# Patient Record
Sex: Female | Born: 1985 | Race: White | Hispanic: No | Marital: Married | Smoking: Never smoker
Health system: Southern US, Community
[De-identification: ages and names within clinical notes are randomized; demographics above are authoritative.]

## PROBLEM LIST (undated history)

## (undated) DIAGNOSIS — F419 Anxiety disorder, unspecified: Secondary | ICD-10-CM

---
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
## 2007-10-29 ENCOUNTER — Inpatient Hospital Stay (HOSPITAL_COMMUNITY): Admission: AD | Admit: 2007-10-29 | Discharge: 2007-10-29 | Payer: Self-pay | Admitting: Obstetrics and Gynecology

## 2008-03-20 ENCOUNTER — Inpatient Hospital Stay (HOSPITAL_COMMUNITY): Admission: AD | Admit: 2008-03-20 | Discharge: 2008-03-20 | Payer: Self-pay | Admitting: Obstetrics and Gynecology

## 2008-04-01 ENCOUNTER — Inpatient Hospital Stay (HOSPITAL_COMMUNITY): Admission: AD | Admit: 2008-04-01 | Discharge: 2008-04-01 | Payer: Self-pay | Admitting: Obstetrics & Gynecology

## 2008-04-23 ENCOUNTER — Inpatient Hospital Stay (HOSPITAL_COMMUNITY): Admission: AD | Admit: 2008-04-23 | Discharge: 2008-04-26 | Payer: Self-pay | Admitting: Obstetrics and Gynecology

## 2008-12-14 ENCOUNTER — Emergency Department (HOSPITAL_BASED_OUTPATIENT_CLINIC_OR_DEPARTMENT_OTHER): Admission: EM | Admit: 2008-12-14 | Discharge: 2008-12-14 | Payer: Self-pay | Admitting: Emergency Medicine

## 2008-12-14 ENCOUNTER — Ambulatory Visit: Payer: Self-pay | Admitting: Diagnostic Radiology

## 2009-02-27 ENCOUNTER — Ambulatory Visit: Payer: Self-pay | Admitting: Radiology

## 2009-02-27 ENCOUNTER — Encounter (INDEPENDENT_AMBULATORY_CARE_PROVIDER_SITE_OTHER): Payer: Self-pay | Admitting: *Deleted

## 2009-02-27 ENCOUNTER — Emergency Department (HOSPITAL_BASED_OUTPATIENT_CLINIC_OR_DEPARTMENT_OTHER): Admission: EM | Admit: 2009-02-27 | Discharge: 2009-02-27 | Payer: Self-pay | Admitting: Emergency Medicine

## 2009-03-10 ENCOUNTER — Encounter (INDEPENDENT_AMBULATORY_CARE_PROVIDER_SITE_OTHER): Payer: Self-pay | Admitting: *Deleted

## 2009-03-10 ENCOUNTER — Emergency Department (HOSPITAL_COMMUNITY): Admission: EM | Admit: 2009-03-10 | Discharge: 2009-03-11 | Payer: Self-pay | Admitting: Emergency Medicine

## 2009-03-20 ENCOUNTER — Ambulatory Visit (HOSPITAL_COMMUNITY): Admission: RE | Admit: 2009-03-20 | Discharge: 2009-03-20 | Payer: Self-pay | Admitting: Gastroenterology

## 2009-03-20 ENCOUNTER — Encounter (INDEPENDENT_AMBULATORY_CARE_PROVIDER_SITE_OTHER): Payer: Self-pay | Admitting: *Deleted

## 2009-03-23 ENCOUNTER — Ambulatory Visit: Payer: Self-pay | Admitting: Internal Medicine

## 2010-06-13 IMAGING — NM NM HEPATO W/GB/PHARM/[PERSON_NAME]
2 series · 12 of 12 positions shown · non-contrast
Comparison: Ultrasound 02/27/2009

CLINICAL DATA: Right upper quadrant abdominal pain.

NUCLEAR MEDICINE HEPATOBILIARY IMAGING WITH GALLBLADDER EF
TECHNIQUE: Sequential images of the abdomen were obtained [DATE]
minutes following intravenous administration of
radiopharmaceutical.  After slow intravenous infusion of 1.87 uCg
Cholecystokinin, gallbladder ejection fraction was determined.
Radiopharmaceutical:  5.3 mCi Fc-WWm Choletec

[he hepatobiliary · 3.43mm/px · 6 of 59 frames shown (1 of 2)]
[frame 5/59]
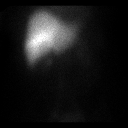
[frame 15/59]
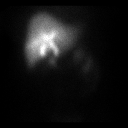
[frame 25/59]
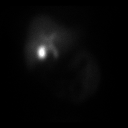
[frame 35/59]
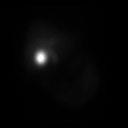
[frame 45/59]
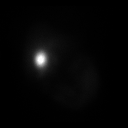
[frame 55/59]
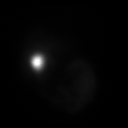

[he hepatobiliary · 3.43mm/px · 6 of 30 frames shown (2 of 2)]
[frame 3/30]
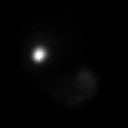
[frame 8/30]
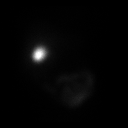
[frame 13/30]
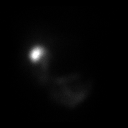
[frame 18/30]
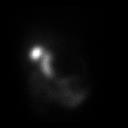
[frame 23/30]
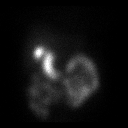
[frame 28/30]
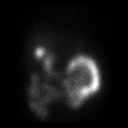

[12 of 12 positions shown; findings below may reference images not displayed]

FINDINGS: There is symmetric uptake in the liver and prompt
excretion into the biliary tree.  Gallbladder is visualized by 15
minutes and there is activity in the small bowel by 20 minutes.

Gallbladder ejection fraction was calculated 85%.  Normal is
greater than 30%.

The patient did experience symptoms during CCK infusion.
IMPRESSION: 1.  Normal biliary patency study.
2.  Normal gallbladder ejection fraction.
3.  Patient's symptoms reproduced with infusion of CCK.

## 2010-09-03 ENCOUNTER — Encounter: Payer: Self-pay | Admitting: Gastroenterology

## 2010-11-18 LAB — DIFFERENTIAL
Basophils Absolute: 0 10*3/uL (ref 0.0–0.1)
Basophils Absolute: 0 10*3/uL (ref 0.0–0.1)
Basophils Relative: 1 % (ref 0–1)
Basophils Relative: 0 % (ref 0–1)
Eosinophils Absolute: 0.1 10*3/uL (ref 0.0–0.7)
Eosinophils Relative: 0 % (ref 0–5)
Lymphocytes Relative: 24 % (ref 12–46)
Monocytes Absolute: 0.6 10*3/uL (ref 0.1–1.0)
Monocytes Relative: 6 % (ref 3–12)
Neutrophils Relative %: 56 % (ref 43–77)

## 2010-11-18 LAB — URINE MICROSCOPIC-ADD ON

## 2010-11-18 LAB — COMPREHENSIVE METABOLIC PANEL
ALT: 13 U/L (ref 0–35)
AST: 16 U/L (ref 0–37)
Albumin: 4.1 g/dL (ref 3.5–5.2)
Alkaline Phosphatase: 93 U/L (ref 39–117)
Alkaline Phosphatase: 77 U/L (ref 39–117)
CO2: 28 mEq/L (ref 19–32)
Calcium: 9.7 mg/dL (ref 8.4–10.5)
Chloride: 105 mEq/L (ref 96–112)
Chloride: 105 mEq/L (ref 96–112)
GFR calc Af Amer: >60 mL/min (ref >60)
GFR calc non Af Amer: >60 mL/min (ref >60)
Glucose, Bld: 97 mg/dL (ref 70–99)
Potassium: 4.5 mEq/L (ref 3.5–5.1)
Potassium: 3.5 mEq/L (ref 3.5–5.1)
Sodium: 142 mEq/L (ref 135–145)
Total Bilirubin: 0.3 mg/dL (ref 0.3–1.2)
Total Bilirubin: 0.7 mg/dL (ref 0.3–1.2)
Total Protein: 7.4 g/dL (ref 6.0–8.3)

## 2010-11-18 LAB — POCT PREGNANCY, URINE: Preg Test, Ur: NEGATIVE

## 2010-11-18 LAB — URINALYSIS, ROUTINE W REFLEX MICROSCOPIC
Bilirubin Urine: NEGATIVE
Glucose, UA: NEGATIVE mg/dL
Specific Gravity, Urine: 1.027 (ref 1.005–1.030)
Urobilinogen, UA: 0.2 mg/dL (ref 0.0–1.0)

## 2010-11-18 LAB — CBC
Hemoglobin: 14.9 g/dL (ref 12.0–15.0)
MCHC: 34.5 g/dL (ref 30.0–36.0)
Platelets: 229 10*3/uL (ref 150–400)
RBC: 5.09 MIL/uL (ref 3.87–5.11)
RDW: 14.6 % (ref 11.5–15.5)
WBC: 9.5 10*3/uL (ref 4.0–10.5)

## 2010-11-18 LAB — LIPASE, BLOOD: Lipase: 58 U/L (ref 23–300)

## 2010-12-25 NOTE — Op Note (Signed)
[name], [name] NO.:  [id number]   MEDICAL RECORD NO.:  [id number]          PATIENT TYPE:  INP   LOCATION:  9133                          FACILITY:  [facility]   PHYSICIAN:  Carrington Clamp, M.D. DATE OF BIRTH:  [birth year]   DATE OF PROCEDURE:  04/24/2008  DATE OF DISCHARGE:                               OPERATIVE REPORT   PREOPERATIVE DIAGNOSIS:  Arrest of active phase.   POSTOPERATIVE DIAGNOSIS:  Arrest of active phase.   PROCEDURE:  Low transverse cesarean section.   SURGEON:  Carrington Clamp, MD   ANESTHESIA:  Epidural.   ESTIMATED BLOOD LOSS:  700 mL.   URINE OUTPUT:  100 mL.   IV FLUIDS:  800 mL.   FINDINGS:  Female infant, vertex OP presentation, 7 pounds 8 ounces.  Apgars 9 and 10.  There were normal tubes, ovaries, and uterus seen.   COMPLICATIONS:  None.   PATHOLOGY:  None.   MEDICATIONS:  Ancef.   COUNTS:  Correct x3.   REASON FOR OPERATION:  Ms. Kim Estes has elected for a induction at  39 and half weeks with a favorable cervix.  The patient had been brought  in for Pitocin and rupture of membranes and had protractedly progressed  to 5 cm.  However, after 4 hours, the patient did not change from 5 cm  despite inadequate labor pattern.  After discussion of the risks,  benefits, and alternatives, the patient elected to undergo cesarean  section for arrest of active phase.   TECHNIQUE:  After adequate epidural anesthesia was achieved, the patient  was prepped and draped in the usual sterile fashion in the dorsal supine  position with leftward tilt.  A Pfannenstiel skin incision was made with  the scalpel and carried down to fascia with Bovie cautery.  The fascia  was incised in midline and carried in transverse curvilinear manner with  Mayo scissors.  The fascia was reflected superiorly and inferiorly from  the rectus muscle and the rectus muscle was split in the midline.  The  peritoneum was entered into bluntly and was  stretched open with good  visualization of the bowel and the bladder.  The Alexis instrument was  placed inside the abdominal cavity.  The vesicouterine fascia tented up  and incised in transverse curvilinear manner.  Blunt dissection was used  to create the bladder flap.   A 2-cm incision was made in the upper portion of lower uterine segment  until clear fluid was noted on entry into the amnion.  The baby was  identified in the vertex OP presentation, delivered without  complication.  The baby was bulb suctioned and the cord was clamped and  cut and the baby was handed to awaiting pediatrics.  The placenta was  then delivered manually and handed to the cord blood bank people.  The  uterus was a cleared of all debris with a wet lap.   The incision was then closed with a running a lock stitch of 0 Monocryl.  An imbricating layer of 0 Monocryl was performed as well.  Hemostasis  was achieved and irrigation was  performed.  The Alexis instrument was  then removed from the abdominal cavity and the peritoneum closed with a  running stitch of 2-0 Vicryl.  This incorporated the rectus muscles as a  separate layer.  The fascia was closed with a running stitch of 0  Vicryl.  The subcutaneous tissue was rendered hemostatic with Bovie  cautery, irrigation and then closed with interrupted stitches of 2-0  plain gut.  The skin was closed with staples.  The patient tolerated the  procedure well and was returned to recovery room in stable condition.      Carrington Clamp, M.D.  Electronically Signed     MH/MEDQ  D:  04/24/2008  T:  04/24/2008  Job:  [id number]

## 2010-12-28 NOTE — Discharge Summary (Signed)
NAMEJOYCLYN, Kim Estes NO.:  [id number]   MEDICAL RECORD NO.:  [id number]          PATIENT TYPE:  INP   LOCATION:  9133                          FACILITY:  [facility]   PHYSICIAN:  Lenoard Aden, M.D.DATE OF BIRTH:  Dec 27, 1985   DATE OF ADMISSION:  04/23/2008  DATE OF DISCHARGE:  04/26/2008                               DISCHARGE SUMMARY   FINAL DIAGNOSES:  Intrauterine pregnancy at 39+ weeks gestation,  positive group B strep, induction of labor, arrest of the active phase  of labor.   PROCEDURE:  Primary low transverse cesarean section.   SURGEON:  Carrington Clamp, MD   COMPLICATIONS:  None.   This 25 year old G1 P0 presents at 4 and half weeks gestation for an  induction secondary to a favorable cervix at term.  The patient's  antepartum course up to this point had just been complicated by a  positive group B strep culture which she was started on penicillin for.  Upon admission the patient also had a false positive HIV test at the  beginning of her pregnancy, but was confirmed negative by Western blot  test and otherwise the patient's antepartum course had been  uncomplicated.  The patient was admitted for induction.  She had a  protracted labor curve progressed about 5 cm, but did not change her  cervix over 4 hours, despite an adequate labor pattern discussion was  held with the patient.  Decision was made to proceed with the cesarean  section at this time secondary to the rest of the active phase of labor.  The patient was taken to the operating room on April 24, 2008 by Dr.  Carrington Clamp where primary low transverse cesarean section was  performed with the delivery of a 7 pounds 8 ounce female infant with  Apgars of 9 and 10.  Delivery went without complications.  The patient's  postoperative course was benign without any significant fevers.  She did  have some mild postoperative anemia.  The patient was already for  discharge on postoperative  day #2, was sent home on a regular diet, told  to decrease activities, told to continue her vitamins and iron  supplement, was given Motrin 600 mg to use every 6 hours as needed for  her pain, was to follow up in our office in 48 hours for her staple  removal.  Instructions and precautions were reviewed with the patient  and with the staple removal in 1 week.      Leilani Able, P.A.-C.      Lenoard Aden, M.D.  Electronically Signed    MB/MEDQ  D:  05/23/2008  T:  05/24/2008  Job:  [id number]

## 2011-05-06 LAB — URINALYSIS, ROUTINE W REFLEX MICROSCOPIC
Bilirubin Urine: NEGATIVE
Hgb urine dipstick: NEGATIVE
Ketones, ur: 40 — AB
Nitrite: NEGATIVE
Protein, ur: NEGATIVE
Specific Gravity, Urine: >1.03 — ABNORMAL HIGH
Urobilinogen, UA: 0.2

## 2011-05-10 LAB — URINALYSIS, ROUTINE W REFLEX MICROSCOPIC
Glucose, UA: NEGATIVE
Leukocytes, UA: NEGATIVE
Protein, ur: NEGATIVE
Specific Gravity, Urine: 1.025
pH: 6

## 2011-05-10 LAB — STREP B DNA PROBE

## 2011-05-10 LAB — URINE MICROSCOPIC-ADD ON

## 2011-05-10 LAB — URINE CULTURE

## 2011-05-15 LAB — CBC
HCT: 29.5 — ABNORMAL LOW
Hemoglobin: 11 — ABNORMAL LOW
MCHC: 33.1
MCV: 85.6
MCV: 85.6
Platelets: 157
Platelets: 177
RBC: 3.89
RDW: 14.4
RDW: 14.6
RDW: 14.8

## 2011-05-15 LAB — RPR: RPR Ser Ql: NONREACTIVE

## 2012-03-19 ENCOUNTER — Emergency Department (HOSPITAL_BASED_OUTPATIENT_CLINIC_OR_DEPARTMENT_OTHER)
Admission: EM | Admit: 2012-03-19 | Discharge: 2012-03-19 | Disposition: A | Discharge status: Home / Self Care | Payer: PRIVATE HEALTH INSURANCE | Attending: Emergency Medicine | Admitting: Emergency Medicine

## 2012-03-19 ENCOUNTER — Encounter (HOSPITAL_BASED_OUTPATIENT_CLINIC_OR_DEPARTMENT_OTHER): Payer: Self-pay

## 2012-03-19 ENCOUNTER — Emergency Department (HOSPITAL_BASED_OUTPATIENT_CLINIC_OR_DEPARTMENT_OTHER): Payer: PRIVATE HEALTH INSURANCE

## 2012-03-19 DIAGNOSIS — IMO0002 Reserved for concepts with insufficient information to code with codable children: Secondary | ICD-10-CM | POA: Insufficient documentation

## 2012-03-19 DIAGNOSIS — S0990XA Unspecified injury of head, initial encounter: Secondary | ICD-10-CM | POA: Insufficient documentation

## 2012-03-19 DIAGNOSIS — H8110 Benign paroxysmal vertigo, unspecified ear: Secondary | ICD-10-CM

## 2012-03-19 MED ORDER — ONDANSETRON 4 MG PO TBDP
4.0000 mg | ORAL_TABLET | Freq: Once | ORAL | Status: AC
  Administered 2012-03-19: 4 mg via ORAL
  Filled 2012-03-19: qty 1

## 2012-03-19 MED ORDER — MECLIZINE HCL 25 MG PO TABS
25.0000 mg | ORAL_TABLET | Freq: Once | ORAL | Status: AC
  Administered 2012-03-19: 25 mg via ORAL
  Filled 2012-03-19: qty 1

## 2012-03-19 MED ORDER — MECLIZINE HCL 50 MG PO TABS: 50.0000 mg | ORAL_TABLET | Freq: Three times a day (TID) | ORAL | Status: AC | PRN

## 2012-03-19 MED ORDER — ONDANSETRON HCL 4 MG PO TABS: 4.0000 mg | ORAL_TABLET | Freq: Four times a day (QID) | ORAL | Status: AC

## 2012-03-19 NOTE — ED Provider Notes (Addendum)
History     CSN: [id number]  Arrival date & time 03/19/12  1052   First MD Initiated Contact with Patient 03/19/12 1113      Chief Complaint  Patient presents with  . Head Injury    (Consider location/radiation/quality/duration/timing/severity/associated sxs/prior treatment) Patient is a 26 y.o. female presenting with head injury. The history is provided by the patient.  Head Injury  The incident occurred 2 days ago. She came to the ER via walk-in. The injury mechanism was a direct blow (hit her head on a chair leg when getting up that caused her vision to go black and then resolved). There was no loss of consciousness. There was no blood loss. The quality of the pain is described as throbbing. The pain is at a severity of 2/10. The pain is mild. The pain has been improving since the injury. Pertinent negatives include no blurred vision, no vomiting, patient does not experience disorientation and no weakness. Associated symptoms comments: Developed dizziness today with spinning like she is on a merry-go-round.  Worse with walking or moving her eyes but the worst with looking up. She has tried nothing for the symptoms. The treatment provided no relief.    History reviewed. No pertinent past medical history.  Past Surgical History  Procedure Date  . Cesarean section     No family history on file.  History  Substance Use Topics  . Smoking status: Never Smoker   . Smokeless tobacco: Not on file  . Alcohol Use: No    OB History    Grav Para Term Preterm Abortions TAB SAB Ect Mult Living                  Review of Systems  Eyes: Negative for blurred vision and visual disturbance.  Gastrointestinal: Positive for nausea. Negative for vomiting.  Neurological: Positive for dizziness. Negative for speech difficulty, weakness, light-headedness and headaches.  All other systems reviewed and are negative.    Allergies  Review of patient's allergies indicates no known  allergies.  Home Medications  No current outpatient prescriptions on file.  BP 140/91  Pulse 77  Temp 98.3 F (36.8 C) (Oral)  Resp 16  Ht 5\' 2"  (1.575 m)  Wt 140 lb (63.504 kg)  BMI 25.61 kg/m2  SpO2 99%  Physical Exam  Nursing note and vitals reviewed. Constitutional: She is oriented to person, place, and time. She appears well-developed and well-nourished. No distress.  HENT:  Head: Normocephalic and atraumatic.  Eyes: EOM are normal. Pupils are equal, round, and reactive to light.  Fundoscopic exam:      The right eye shows no papilledema.       The left eye shows no papilledema.  Neck: Normal range of motion. Neck supple.  Cardiovascular: Normal rate, regular rhythm, normal heart sounds and intact distal pulses.  Exam reveals no friction rub.   No murmur heard. Pulmonary/Chest: Effort normal and breath sounds normal. She has no wheezes. She has no rales.  Abdominal: Soft. Bowel sounds are normal. She exhibits no distension. There is no tenderness. There is no rebound and no guarding.  Musculoskeletal: Normal range of motion. She exhibits no tenderness.       No edema  Lymphadenopathy:    She has no cervical adenopathy.  Neurological: She is alert and oriented to person, place, and time. She has normal strength. No cranial nerve deficit or sensory deficit. Coordination and gait normal.       No photophobia or nystagmus.  Finger to nose is accurate however slow due to exacerbating her vertiginous sx.  Skin: Skin is warm and dry. No rash noted.  Psychiatric: She has a normal mood and affect. Her behavior is normal.    ED Course  Procedures (including critical care time)  Labs Reviewed - No data to display Ct Head Wo Contrast  03/19/2012  *RADIOLOGY REPORT*  Clinical Data: Hit head 2 days ago with right frontoparietal pain and dizziness.  Some blurred vision  CT HEAD WITHOUT CONTRAST  Technique:  Contiguous axial images were obtained from the base of the skull through the  vertex without contrast.  Comparison: CT brain of 12/14/2008  Findings: The ventricular system is normal in size and configuration, and the septum is in a normal midline position.  The fourth ventricle and basilar cisterns are unremarkable and stable. No hemorrhage, mass lesion, or acute infarction is seen.  On bone window images no acute bony abnormality is seen.  Some debris is noted within the left external auditory canal which may represent cerumen.  The paranasal sinuses are clear as are the mastoid air cells.  IMPRESSION: Negative unenhanced CT of the brain.  Original Report Authenticated By: Juline Patch, M.D.     1. Vertigo, benign positional       MDM   Patient with a minor head injury 2 days ago and yesterday had a slight episode of vertigo but today presents complaining of symptoms typical of benign positional vertigo.  No systemic or infectious sx.  Normal neuro exam without weakness, ataxia or cerebellar findings on exam.  However severe dizziness with walking or doing finger to nose testing.  Normal vision.  Sx are reproducible with movement of the head and attempting to walk.  No hx of Stroke and low likelihood.  No risk factors and normal VS. Will treat for peripheral vertigo and re-eval. Head Ct pending due to recent trauma.   12:06 PM Head CT neg.  Pt still mildly vertiginous but only recently had meds.  12:46 PM Pt feeling much better.  Normal gait and will d/c home.       Gwyneth Sprout, MD 03/19/12 1247  Gwyneth Sprout, MD 03/19/12 1248

## 2012-03-19 NOTE — ED Notes (Signed)
Pt reports she hit head 2 days ago on wooden leg of chair-denies LOC but states "everyhting went black"-dizziness started yesterday-dizziness worse this am with nausea-pt A/O-steady gait into triage room

## 2014-09-12 ENCOUNTER — Emergency Department (HOSPITAL_BASED_OUTPATIENT_CLINIC_OR_DEPARTMENT_OTHER)
Admission: EM | Admit: 2014-09-12 | Discharge: 2014-09-12 | Disposition: A | Discharge status: Home / Self Care | Payer: PRIVATE HEALTH INSURANCE | Attending: Emergency Medicine | Admitting: Emergency Medicine

## 2014-09-12 ENCOUNTER — Encounter (HOSPITAL_BASED_OUTPATIENT_CLINIC_OR_DEPARTMENT_OTHER): Payer: Self-pay | Admitting: *Deleted

## 2014-09-12 ENCOUNTER — Emergency Department (HOSPITAL_BASED_OUTPATIENT_CLINIC_OR_DEPARTMENT_OTHER): Payer: PRIVATE HEALTH INSURANCE

## 2014-09-12 DIAGNOSIS — R1033 Periumbilical pain: Secondary | ICD-10-CM | POA: Diagnosis present

## 2014-09-12 DIAGNOSIS — R630 Anorexia: Secondary | ICD-10-CM | POA: Diagnosis not present

## 2014-09-12 DIAGNOSIS — N8329 Other ovarian cysts: Secondary | ICD-10-CM | POA: Diagnosis not present

## 2014-09-12 DIAGNOSIS — Z9889 Other specified postprocedural states: Secondary | ICD-10-CM | POA: Diagnosis not present

## 2014-09-12 DIAGNOSIS — Z3202 Encounter for pregnancy test, result negative: Secondary | ICD-10-CM | POA: Diagnosis not present

## 2014-09-12 DIAGNOSIS — N83201 Unspecified ovarian cyst, right side: Secondary | ICD-10-CM

## 2014-09-12 LAB — COMPREHENSIVE METABOLIC PANEL
ALT: 19 U/L (ref 0–35)
AST: 21 U/L (ref 0–37)
Albumin: 4.5 g/dL (ref 3.5–5.2)
Alkaline Phosphatase: 60 U/L (ref 39–117)
Anion gap: 5 (ref 5–15)
BUN: 10 mg/dL (ref 6–23)
CO2: 25 mmol/L (ref 19–32)
CREATININE: 0.63 mg/dL (ref 0.50–1.10)
Calcium: 9.6 mg/dL (ref 8.4–10.5)
Chloride: 106 mmol/L (ref 96–112)
GFR calc Af Amer: >90 mL/min (ref >90)
Glucose, Bld: 98 mg/dL (ref 70–99)
Potassium: 3.8 mmol/L (ref 3.5–5.1)
Sodium: 136 mmol/L (ref 135–145)
TOTAL PROTEIN: 7.8 g/dL (ref 6.0–8.3)
Total Bilirubin: 0.5 mg/dL (ref 0.3–1.2)

## 2014-09-12 LAB — URINALYSIS, ROUTINE W REFLEX MICROSCOPIC
Bilirubin Urine: NEGATIVE
Glucose, UA: NEGATIVE mg/dL
HGB URINE DIPSTICK: NEGATIVE
KETONES UR: NEGATIVE mg/dL
Leukocytes, UA: NEGATIVE
Nitrite: NEGATIVE
Protein, ur: NEGATIVE mg/dL
Specific Gravity, Urine: 1.007 (ref 1.005–1.030)
Urobilinogen, UA: 0.2 mg/dL (ref 0.0–1.0)
pH: 7 (ref 5.0–8.0)

## 2014-09-12 LAB — CBC WITH DIFFERENTIAL/PLATELET
Basophils Absolute: 0 10*3/uL (ref 0.0–0.1)
Basophils Relative: 0 % (ref 0–1)
EOS ABS: 0 10*3/uL (ref 0.0–0.7)
Eosinophils Relative: 0 % (ref 0–5)
HEMATOCRIT: 43.8 % (ref 36.0–46.0)
Hemoglobin: 14.5 g/dL (ref 12.0–15.0)
Lymphocytes Relative: 25 % (ref 12–46)
Lymphs Abs: 2.5 10*3/uL (ref 0.7–4.0)
MCH: 29.8 pg (ref 26.0–34.0)
MCHC: 33.1 g/dL (ref 30.0–36.0)
MCV: 89.9 fL (ref 78.0–100.0)
MONO ABS: 0.7 10*3/uL (ref 0.1–1.0)
Monocytes Relative: 6 % (ref 3–12)
NEUTROS ABS: 7.1 10*3/uL (ref 1.7–7.7)
NEUTROS PCT: 69 % (ref 43–77)
PLATELETS: 261 10*3/uL (ref 150–400)
RBC: 4.87 MIL/uL (ref 3.87–5.11)
RDW: 13.2 % (ref 11.5–15.5)
WBC: 10.3 10*3/uL (ref 4.0–10.5)

## 2014-09-12 LAB — PREGNANCY, URINE: Preg Test, Ur: NEGATIVE

## 2014-09-12 LAB — LIPASE, BLOOD: LIPASE: 29 U/L (ref 11–59)

## 2014-09-12 MED ORDER — ONDANSETRON 8 MG PO TBDP
8.0000 mg | ORAL_TABLET | Freq: Once | ORAL | Status: AC
  Administered 2014-09-12: 8 mg via ORAL
  Filled 2014-09-12: qty 1

## 2014-09-12 MED ORDER — IOHEXOL 300 MG/ML  SOLN
100.0000 mL | Freq: Once | INTRAMUSCULAR | Status: AC | PRN
  Administered 2014-09-12: 100 mL via INTRAVENOUS

## 2014-09-12 MED ORDER — PANTOPRAZOLE SODIUM 20 MG PO TBEC: 20.0000 mg | DELAYED_RELEASE_TABLET | Freq: Every day | ORAL | Status: DC

## 2014-09-12 MED ORDER — IOHEXOL 300 MG/ML  SOLN
25.0000 mL | Freq: Once | INTRAMUSCULAR | Status: AC | PRN
  Administered 2014-09-12: 25 mL via ORAL

## 2014-09-12 MED ORDER — MORPHINE SULFATE 4 MG/ML IJ SOLN
4.0000 mg | Freq: Once | INTRAMUSCULAR | Status: AC
  Administered 2014-09-12: 4 mg via INTRAVENOUS
  Filled 2014-09-12: qty 1

## 2014-09-12 NOTE — ED Notes (Signed)
Patient transported to CT 

## 2014-09-12 NOTE — ED Provider Notes (Signed)
CSN: [id number]     Arrival date & time 09/12/14  1411 History   First MD Initiated Contact with Patient 09/12/14 1423     Chief Complaint  Patient presents with  . Abdominal Pain     (Consider location/radiation/quality/duration/timing/severity/associated sxs/prior Treatment) HPI Comments: Pt states that she has been having intermittent pain over the last year. Pt states that it has gotten so bad that she decided to come in today. States that the has vomiting associated with eating. No fever. Hasn't been seen because she is scared of needles:no dysuria or vaginal discharge  Patient is a 29 y.o. female presenting with abdominal pain. The history is provided by the patient. No language interpreter was used.  Abdominal Pain Pain location:  Periumbilical Pain quality: aching   Pain radiates to:  Does not radiate Pain severity:  Moderate Onset quality:  Gradual Timing:  Intermittent Progression:  Worsening Context: not previous surgeries   Relieved by:  Nothing Worsened by:  Nothing tried Ineffective treatments:  None tried Associated symptoms: anorexia, nausea and vomiting   Associated symptoms: no cough, no diarrhea and no fever     History reviewed. No pertinent past medical history. Past Surgical History  Procedure Laterality Date  . Cesarean section     No family history on file. History  Substance Use Topics  . Smoking status: Never Smoker   . Smokeless tobacco: Not on file  . Alcohol Use: No   OB History    No data available     Review of Systems  Constitutional: Negative for fever.  Respiratory: Negative for cough.   Gastrointestinal: Positive for nausea, vomiting, abdominal pain and anorexia. Negative for diarrhea.  All other systems reviewed and are negative.     Allergies  Codeine  Home Medications   Prior to Admission medications   Not on File   BP 136/92 mmHg  Pulse 58  Temp(Src) 98.3 F (36.8 C) (Oral)  Resp 16  SpO2 100% Physical Exam   Constitutional: She is oriented to person, place, and time. She appears well-developed and well-nourished.  HENT:  Head: Normocephalic and atraumatic.  Cardiovascular: Normal rate and regular rhythm.   Pulmonary/Chest: Effort normal and breath sounds normal.  Abdominal: Bowel sounds are normal. There is no tenderness.  Musculoskeletal: Normal range of motion.  Neurological: She is alert and oriented to person, place, and time. She exhibits normal muscle tone. Coordination normal.  Skin: Skin is warm and dry. No rash noted.  Nursing note and vitals reviewed.   ED Course  Procedures (including critical care time) Labs Review Labs Reviewed  URINALYSIS, ROUTINE W REFLEX MICROSCOPIC  PREGNANCY, URINE  COMPREHENSIVE METABOLIC PANEL  CBC WITH DIFFERENTIAL/PLATELET  LIPASE, BLOOD    Imaging Review Ct Abdomen Pelvis W Contrast  09/12/2014   CLINICAL DATA:  Upper abdominal pain on and off since summer time, postprandial and periumbilical pain, nausea  EXAM: CT ABDOMEN AND PELVIS WITH CONTRAST  TECHNIQUE: Multidetector CT imaging of the abdomen and pelvis was performed using the standard protocol following bolus administration of intravenous contrast. Sagittal and coronal MPR images reconstructed from axial data set.  CONTRAST:  25mL OMNIPAQUE IOHEXOL 300 MG/ML SOLN orally, 100mL OMNIPAQUE IOHEXOL 300 MG/ML SOLN IV  COMPARISON:  02/27/2009  FINDINGS: Lung bases clear.  Liver, spleen, pancreas, kidneys, and adrenal glands normal.  Mid stomach and lead distended, potentially a contraction, unable to exclude a gastric wall thickening.  Large and small bowel loops grossly unremarkable.  Normal appendix.  IUD within  uterus.  2.3 x 1.9 cm diameter cyst RIGHT ovary.  Bladder, ureters, uterus and adnexae otherwise unremarkable.  No additional mass, adenopathy, free fluid, free air, or inflammatory process.  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  IMPRESSION: Small RIGHT ovarian cyst.  No definite acute  intra-abdominal or intrapelvic abnormalities.  Questionable contraction at mid stomach, unable to exclude gastric wall thickening with this appearance; recommend followup upper GI exam.   Electronically Signed   By: Ulyses Southward M.D.   On: 09/12/2014 16:46     EKG Interpretation None      MDM   Final diagnoses:  Periumbilical pain  Periumbilical abdominal pain  Cyst of right ovary    Think this is likely and ulcer. Discussed with pt and will send home with protonix. Pt told about ovarian cyst    Teressa Lower, NP 09/12/14 1657  Purvis Sheffield, MD 09/13/14 2321

## 2014-09-12 NOTE — Discharge Instructions (Signed)

## 2014-09-12 NOTE — ED Notes (Signed)
Pt sts that after drinking the CT contrast, she already feels bloated at this time. Pain has improved.

## 2014-09-12 NOTE — ED Notes (Signed)
Abdominal pain when she eats. This has been going on for months per pt but this is the worse this has been. Bloating. Sometimes she gets diarrhea. Vomiting sometimes with the pain.

## 2014-09-12 NOTE — ED Notes (Signed)
NP at bedside discussing results with patient.

## 2018-04-01 ENCOUNTER — Emergency Department (HOSPITAL_BASED_OUTPATIENT_CLINIC_OR_DEPARTMENT_OTHER)
Admission: EM | Admit: 2018-04-01 | Discharge: 2018-04-02 | Disposition: A | Discharge status: Home / Self Care | Payer: PRIVATE HEALTH INSURANCE | Attending: Emergency Medicine | Admitting: Emergency Medicine

## 2018-04-01 ENCOUNTER — Other Ambulatory Visit: Payer: Self-pay

## 2018-04-01 ENCOUNTER — Encounter (HOSPITAL_BASED_OUTPATIENT_CLINIC_OR_DEPARTMENT_OTHER): Payer: Self-pay | Admitting: Emergency Medicine

## 2018-04-01 DIAGNOSIS — M79604 Pain in right leg: Secondary | ICD-10-CM

## 2018-04-01 DIAGNOSIS — S8011XD Contusion of right lower leg, subsequent encounter: Secondary | ICD-10-CM

## 2018-04-01 DIAGNOSIS — W19XXXA Unspecified fall, initial encounter: Secondary | ICD-10-CM | POA: Insufficient documentation

## 2018-04-01 DIAGNOSIS — M79661 Pain in right lower leg: Secondary | ICD-10-CM | POA: Insufficient documentation

## 2018-04-01 DIAGNOSIS — S8991XD Unspecified injury of right lower leg, subsequent encounter: Secondary | ICD-10-CM | POA: Diagnosis present

## 2018-04-01 NOTE — ED Triage Notes (Signed)
Pt states she fell last Saturday five stairs down, was seen on the ED on Saturday, dc home with a ace wrap, oriented to take the wrap today and if not improvement return to the ED. Pt states pain is worse and she can't walk.

## 2018-04-02 MED ORDER — ACETAMINOPHEN 500 MG PO TABS: 1000.0000 mg | ORAL_TABLET | Freq: Three times a day (TID) | ORAL | 0 refills | Status: AC

## 2018-04-02 NOTE — ED Provider Notes (Addendum)
[facility] EMERGENCY DEPARTMENT Provider Note  CSN: [id number] Arrival date & time: 04/01/18 2200  Chief Complaint(s) Leg Injury  HPI Kim Estes is a 32 y.o. female who had a mechanical fall 5 days ago and 5 stairs resulting in right lower extremity injury.  She was seen at another emergency department who obtained plain films that did not reveal any obvious fractures or dislocations.  She was diagnosed with a ankle sprain at that time and an Ace wrap and crutches were provided.  Patient reports that she has not had improvement with regards to her pain.  She states that she fell again yesterday due to the crutches landing on the right leg.  She endorses development of bruising on the anterior aspect of the right shin.  Her pain is exacerbated only with weightbearing and palpation over the bruise.  Pain is alleviated by immobilization.  She endorses other soft tissue bruising but no other significant injuries.  No headache, neck pain, back pain, hip pain.  She denies any right ankle or foot pain.  No weakness or loss of sensation.  Denies any other physical complaints.  HPI  Past Medical History History reviewed. No pertinent past medical history. There are no active problems to display for this patient.  Home Medication(s) Prior to Admission medications   Medication Sig Start Date End Date Taking? Authorizing Provider  acetaminophen (TYLENOL) 500 MG tablet Take 2 tablets (1,000 mg total) by mouth every 8 (eight) hours for 5 days. Do not take more than 4000 mg of acetaminophen (Tylenol) in a 24-hour period. Please note that other medicines that you may be prescribed may have Tylenol as well. 04/02/18 04/07/18  Daltyn Degroat, Amadeo Garnet, MD  pantoprazole (PROTONIX) 20 MG tablet Take 1 tablet (20 mg total) by mouth daily. 09/12/14   Teressa Lower, NP                                                                                                                                      Past Surgical History Past Surgical History:  Procedure Laterality Date  . CESAREAN SECTION     Family History No family history on file.  Social History Social History   Tobacco Use  . Smoking status: Never Smoker  Substance Use Topics  . Alcohol use: No  . Drug use: No   Allergies Codeine  Review of Systems Review of Systems As noted in HPI Physical Exam Vital Signs  I have reviewed the triage vital signs BP 113/82 (BP Location: Right Arm)   Pulse 90   Temp 98.9 F (37.2 C) (Oral)   Resp 16   Ht 5' 2.5" (1.588 m)   Wt 81.6 kg   SpO2 99%   BMI 32.40 kg/m   Physical Exam  Constitutional: She is oriented to person, place, and time. She appears well-developed and well-nourished. No distress.  HENT:  Head: Normocephalic and atraumatic.  Right Ear: External ear normal.  Left Ear: External ear normal.  Nose: Nose normal.  Eyes: Conjunctivae and EOM are normal. No scleral icterus.  Neck: Normal range of motion and phonation normal.  Cardiovascular: Normal rate and regular rhythm.  Pulmonary/Chest: Effort normal. No stridor. No respiratory distress.  Abdominal: She exhibits no distension.  Musculoskeletal: Normal range of motion. She exhibits no edema.       Arms:      Right lower leg: She exhibits tenderness. She exhibits no bony tenderness, no edema, no deformity and no laceration.       Legs: Neurological: She is alert and oriented to person, place, and time.  Skin: She is not diaphoretic.  Psychiatric: She has a normal mood and affect. Her behavior is normal.  Vitals reviewed.   ED Results and Treatments Labs (all labs ordered are listed, but only abnormal results are displayed) Labs Reviewed - No data to display                                                                                                                       EKG  EKG Interpretation  Date/Time:    Ventricular Rate:    PR Interval:    QRS Duration:   QT Interval:    QTC  Calculation:   R Axis:     Text Interpretation:        Radiology No results found. Pertinent labs & imaging results that were available during my care of the patient were reviewed by me and considered in my medical decision making (see chart for details).  Medications Ordered in ED Medications - No data to display                                                                                                                                  Procedures Procedures  (including critical care time)  Medical Decision Making / ED Course I have reviewed the nursing notes for this encounter and the patient's prior records (if available in EHR or on provided paperwork).    Right leg pain following mechanical fall.  Patient has hematoma to the anterior aspect of the right shin which is tender to palpation.  Neurovascularly intact distally.  Initial plain films of the facility negative.  Likely soft tissue hematoma versus periosteal hematoma.  Additionally possible hairline fracture but less likely.  Given fall yesterday, discussed possibility of obtaining additional plain  film today however with shared decision making we opted to withhold any additional imaging.  Patient was provided with a cam walker and instructed to only apply less than 25% of her weight on her right extremity and to continue using her crutches.  Nonsurgical orthopedic surgery referral was given for reevaluation in 1 to 2 weeks if pain persists.   The patient is safe for discharge with strict return precautions.  Final Clinical Impression(s) / ED Diagnoses Final diagnoses:  Right leg pain  Leg hematoma, right, subsequent encounter   Disposition: Discharge  Condition: Good  I have discussed the results, Dx and Tx plan with the patient who expressed understanding and agree(s) with the plan. Discharge instructions discussed at great length. The patient was given strict return precautions who verbalized understanding of the  instructions. No further questions at time of discharge.    ED Discharge Orders         Ordered    acetaminophen (TYLENOL) 500 MG tablet  Every 8 hours     04/02/18 0009           Follow Up: Lenda KelpHudnall, Shane R, MD [address] [place] [id number] [phone]  Schedule an appointment as soon as possible for a visit  in 1-2 weeks if pain persists and does not improve.      This chart was dictated using voice recognition software.  Despite best efforts to proofread,  errors can occur which can change the documentation meaning.     Nira Connardama, Vidyuth Belsito Eduardo, MD 04/02/18 [phone]

## 2019-01-19 ENCOUNTER — Encounter (HOSPITAL_BASED_OUTPATIENT_CLINIC_OR_DEPARTMENT_OTHER): Payer: Self-pay | Admitting: Emergency Medicine

## 2019-01-19 ENCOUNTER — Emergency Department (HOSPITAL_BASED_OUTPATIENT_CLINIC_OR_DEPARTMENT_OTHER): Payer: PRIVATE HEALTH INSURANCE

## 2019-01-19 ENCOUNTER — Emergency Department (HOSPITAL_BASED_OUTPATIENT_CLINIC_OR_DEPARTMENT_OTHER)
Admission: EM | Admit: 2019-01-19 | Discharge: 2019-01-19 | Disposition: A | Discharge status: Home / Self Care | Payer: PRIVATE HEALTH INSURANCE | Attending: Emergency Medicine | Admitting: Emergency Medicine

## 2019-01-19 ENCOUNTER — Other Ambulatory Visit: Payer: Self-pay

## 2019-01-19 DIAGNOSIS — R112 Nausea with vomiting, unspecified: Secondary | ICD-10-CM | POA: Diagnosis not present

## 2019-01-19 DIAGNOSIS — R002 Palpitations: Secondary | ICD-10-CM | POA: Insufficient documentation

## 2019-01-19 DIAGNOSIS — R0781 Pleurodynia: Secondary | ICD-10-CM

## 2019-01-19 DIAGNOSIS — R5381 Other malaise: Secondary | ICD-10-CM | POA: Insufficient documentation

## 2019-01-19 DIAGNOSIS — R0602 Shortness of breath: Secondary | ICD-10-CM | POA: Insufficient documentation

## 2019-01-19 DIAGNOSIS — R079 Chest pain, unspecified: Secondary | ICD-10-CM | POA: Diagnosis present

## 2019-01-19 DIAGNOSIS — Z20828 Contact with and (suspected) exposure to other viral communicable diseases: Secondary | ICD-10-CM | POA: Diagnosis not present

## 2019-01-19 DIAGNOSIS — R071 Chest pain on breathing: Secondary | ICD-10-CM | POA: Diagnosis not present

## 2019-01-19 HISTORY — DX: Anxiety disorder, unspecified: F41.9

## 2019-01-19 LAB — CBC WITH DIFFERENTIAL/PLATELET
Abs Immature Granulocytes: 0.02 10*3/uL (ref 0.00–0.07)
Basophils Absolute: 0 10*3/uL (ref 0.0–0.1)
Basophils Relative: 0 %
Eosinophils Absolute: 0 10*3/uL (ref 0.0–0.5)
Eosinophils Relative: 1 %
HCT: 43.4 % (ref 36.0–46.0)
Hemoglobin: 13.8 g/dL (ref 12.0–15.0)
Immature Granulocytes: 0 %
Lymphocytes Relative: 18 %
Lymphs Abs: 1.5 10*3/uL (ref 0.7–4.0)
MCH: 29.3 pg (ref 26.0–34.0)
MCHC: 31.8 g/dL (ref 30.0–36.0)
MCV: 92.1 fL (ref 80.0–100.0)
Monocytes Absolute: 0.5 10*3/uL (ref 0.1–1.0)
Monocytes Relative: 6 %
Neutro Abs: 6.5 10*3/uL (ref 1.7–7.7)
Neutrophils Relative %: 75 %
Platelets: 263 10*3/uL (ref 150–400)
RBC: 4.71 MIL/uL (ref 3.87–5.11)
RDW: 12.8 % (ref 11.5–15.5)
WBC: 8.6 10*3/uL (ref 4.0–10.5)
nRBC: 0 % (ref 0.0–0.2)

## 2019-01-19 LAB — COMPREHENSIVE METABOLIC PANEL
ALT: 17 U/L (ref 0–44)
AST: 18 U/L (ref 15–41)
Albumin: 4.1 g/dL (ref 3.5–5.0)
Alkaline Phosphatase: 48 U/L (ref 38–126)
Anion gap: 6 (ref 5–15)
BUN: 11 mg/dL (ref 6–20)
CO2: 25 mmol/L (ref 22–32)
Calcium: 9.3 mg/dL (ref 8.9–10.3)
Chloride: 106 mmol/L (ref 98–111)
Creatinine, Ser: 0.61 mg/dL (ref 0.44–1.00)
GFR calc Af Amer: >60 mL/min (ref >60)
GFR calc non Af Amer: >60 mL/min (ref >60)
Glucose, Bld: 92 mg/dL (ref 70–99)
Potassium: 3.9 mmol/L (ref 3.5–5.1)
Sodium: 137 mmol/L (ref 135–145)
Total Bilirubin: 0.9 mg/dL (ref 0.3–1.2)
Total Protein: 7 g/dL (ref 6.5–8.1)

## 2019-01-19 LAB — URINALYSIS, ROUTINE W REFLEX MICROSCOPIC
Bilirubin Urine: NEGATIVE
Glucose, UA: NEGATIVE mg/dL
Hgb urine dipstick: NEGATIVE
Ketones, ur: NEGATIVE mg/dL
Leukocytes,Ua: NEGATIVE
Nitrite: NEGATIVE
Protein, ur: NEGATIVE mg/dL
Specific Gravity, Urine: 1.01 (ref 1.005–1.030)
pH: 8 (ref 5.0–8.0)

## 2019-01-19 LAB — TROPONIN I: Troponin I: <0.03 ng/mL (ref <0.03)

## 2019-01-19 LAB — D-DIMER, QUANTITATIVE (NOT AT ARMC): D-Dimer, Quant: <0.27 ug/mL-FEU (ref 0.00–0.50)

## 2019-01-19 LAB — PREGNANCY, URINE: Preg Test, Ur: NEGATIVE

## 2019-01-19 MED ORDER — ACETAMINOPHEN 325 MG PO TABS
650.0000 mg | ORAL_TABLET | Freq: Once | ORAL | Status: AC
  Administered 2019-01-19: 650 mg via ORAL
  Filled 2019-01-19: qty 2

## 2019-01-19 MED ORDER — LACTATED RINGERS IV BOLUS
1000.0000 mL | Freq: Once | INTRAVENOUS | Status: AC
  Administered 2019-01-19: 09:00:00 1000 mL via INTRAVENOUS

## 2019-01-19 NOTE — ED Triage Notes (Signed)
Chest pain with elevated heart rate, sob since Sunday.  Some nausea.  No numbness or tingling.  Pt very anxious.  Pt tearful during triage.

## 2019-01-19 NOTE — ED Provider Notes (Signed)
[place] EMERGENCY DEPARTMENT Provider Note   CSN: [id number] Arrival date & time: 01/19/19  0756    History   Chief Complaint Chief Complaint  Patient presents with  . Chest Pain    HPI Kim Estes is a 33 y.o. female.     Patient is a 33 year old female with a history of anxiety and prior diagnosis of possible Lyme disease a year ago who is presenting today with 2 days of multiple vague complaints.  She states she felt fine on Saturday and they were working outside in the yard but did get a little lightheaded by the end of the afternoon.  Saturday she felt okay but noticed that she was having intermittent heart racing even while she was laying down.  Yesterday she started having discomfort which she describes as a pressure tight uncomfortable pain throughout her chest which is worse with taking a deep breath and some mild shortness of breath.  She has had intermittent nausea and vomited 3 times yesterday but denies any diarrhea.  She has had a minimal dry cough and a mild sore throat.  Today she states she feels much worse with more generalized achiness or discomfort in her chest and feeling of breathlessness.  She states seems like her heart rate is going up and down from the 50s to the 120s based on her Fitbit.  She denies any recent travel, unilateral leg pain or swelling.  The people who live with her have been asymptomatic without infectious symptoms.  She did not take any medications and denies any recent tick exposure or rashes.  She does admit to being under more stress at home than normal but states she does not feel anxious.  She had her birth control implant removed in February and last menses was last week.  The history is provided by the patient.  Chest Pain    Past Medical History:  Diagnosis Date  . Anxiety     There are no active problems to display for this patient.   Past Surgical History:  Procedure Laterality Date  . CESAREAN SECTION        OB History   No obstetric history on file.      Home Medications    Prior to Admission medications   Medication Sig Start Date End Date Taking? Authorizing Provider  pantoprazole (PROTONIX) 20 MG tablet Take 1 tablet (20 mg total) by mouth daily. 09/12/14   Glendell Docker, NP    Family History No family history on file.  Social History Social History   Tobacco Use  . Smoking status: Never Smoker  Substance Use Topics  . Alcohol use: No  . Drug use: No     Allergies   Codeine; Cranberry; Petrolatum; and Solbar pf spf15   Review of Systems Review of Systems  Cardiovascular: Positive for chest pain.  All other systems reviewed and are negative.    Physical Exam Updated Vital Signs Pulse 91   Resp 16   LMP 01/17/2019   SpO2 100%   Physical Exam Vitals signs and nursing note reviewed.  Constitutional:      General: She is not in acute distress.    Appearance: She is well-developed.     Comments: Tearful on exam  HENT:     Head: Normocephalic and atraumatic.     Right Ear: Tympanic membrane normal.     Left Ear: Tympanic membrane normal.     Nose: Nose normal.     Mouth/Throat:  Mouth: Mucous membranes are moist.     Pharynx: Oropharynx is clear.  Eyes:     Pupils: Pupils are equal, round, and reactive to light.  Cardiovascular:     Rate and Rhythm: Normal rate and regular rhythm.     Pulses: Normal pulses.          Radial pulses are 2+ on the right side and 2+ on the left side.     Heart sounds: Normal heart sounds. No murmur. No friction rub.  Pulmonary:     Effort: Pulmonary effort is normal.     Breath sounds: Normal breath sounds. No wheezing or rales.  Abdominal:     General: Bowel sounds are normal. There is no distension.     Palpations: Abdomen is soft.     Tenderness: There is no abdominal tenderness. There is no guarding or rebound.  Musculoskeletal: Normal range of motion.        General: No tenderness.     Right lower leg: No  edema.     Left lower leg: No edema.     Comments: No edema  Skin:    General: Skin is warm and dry.     Findings: No rash.  Neurological:     General: No focal deficit present.     Mental Status: She is alert and oriented to person, place, and time. Mental status is at baseline.     Cranial Nerves: No cranial nerve deficit.  Psychiatric:        Behavior: Behavior normal.     Comments: Tearful and somewhat anxious appearing      ED Treatments / Results  Labs (all labs ordered are listed, but only abnormal results are displayed) Labs Reviewed  NOVEL CORONAVIRUS, NAA (HOSPITAL ORDER, SEND-OUT TO REF LAB)  CBC WITH DIFFERENTIAL/PLATELET  COMPREHENSIVE METABOLIC PANEL  URINALYSIS, ROUTINE W REFLEX MICROSCOPIC  PREGNANCY, URINE  D-DIMER, QUANTITATIVE (NOT AT [facility])  TROPONIN I    EKG EKG Interpretation  Date/Time:  Tuesday January 19 2019 08:05:25 EDT Ventricular Rate:  81 PR Interval:    QRS Duration: 92 QT Interval:  377 QTC Calculation: 438 R Axis:   92 Text Interpretation:  Sinus rhythm Borderline right axis deviation No previous tracing Confirmed by Gwyneth SproutPlunkett, Shadd Dunstan ([id number]) on 01/19/2019 8:17:55 AM   Radiology Dg Chest Port 1 View  Result Date: 01/19/2019 CLINICAL DATA:  Shortness of breath EXAM: PORTABLE CHEST 1 VIEW COMPARISON:  None. FINDINGS: There is no evident edema or consolidation. Heart is upper normal in size with pulmonary vascularity normal. No adenopathy. There is slight upper thoracic levoscoliosis. IMPRESSION: No edema or consolidation.  Heart upper normal in size. Electronically Signed   By: Bretta BangWilliam  Woodruff III M.D.   On: 01/19/2019 08:37    Procedures Procedures (including critical care time)  Medications Ordered in ED Medications  lactated ringers bolus 1,000 mL (has no administration in time range)     Initial Impression / Assessment and Plan / ED Course  I have reviewed the triage vital signs and the nursing notes.  Pertinent labs & imaging  results that were available during my care of the patient were reviewed by me and considered in my medical decision making (see chart for details).       Patient is a healthy 33 year old female presenting today with complaints of heart racing, pleuritic chest pain, shortness of breath and now generalized body aches.  Patient is tearful on exam but in no acute distress.  Heart rate is between  70 and 90.  Pain is not reproducible with palpation of the chest but only with breathing.  Lung sounds are clear with low suspicion for pneumothorax and pneumonia.  Patient does have some minimal infectious symptoms with vomiting yesterday, mild dry cough and sore throat with generalized body aches could be early COVID.  However also symptoms started after working in the yard on Saturday in the heat and concern for possible dehydration.  EKG is not consistent with pericarditis.  Low risk for PE based on Wells criteria.  EKG also does not show ACS and low suspicion for dissection. Chest x-ray, CBC, CMP, UA, UPT, d-dimer and troponin pending.  Patient given some IV fluids as concern for possible dehydration.  10:13 AM Patient's labs, x-ray and EKG without acute findings.  D-dimer is negative.  Patient's heart rate did improve after IV fluids to the 60s.  Discussed with her that this could be early infectious process and recommended quarantining and COVID testing was done.  Also recommending staying hydrated and following up with PCP if symptoms worsen.  Vivianne Spencelina F Lich was evaluated in Emergency Department on 01/19/2019 for the symptoms described in the history of present illness. She was evaluated in the context of the global COVID-19 pandemic, which necessitated consideration that the patient might be at risk for infection with the SARS-CoV-2 virus that causes COVID-19. Institutional protocols and algorithms that pertain to the evaluation of patients at risk for COVID-19 are in a state of rapid change based on  information released by regulatory bodies including the CDC and federal and state organizations. These policies and algorithms were followed during the patient's care in the ED.    Final Clinical Impressions(s) / ED Diagnoses   Final diagnoses:  Pleuritic chest pain  [facility]    ED Discharge Orders    None       Gwyneth SproutPlunkett, Theodosia Bahena, MD 01/19/19 1015

## 2019-01-20 LAB — NOVEL CORONAVIRUS, NAA (HOSP ORDER, SEND-OUT TO REF LAB; TAT 18-24 HRS): SARS-CoV-2, NAA: NOT DETECTED

## 2022-07-03 ENCOUNTER — Encounter (HOSPITAL_BASED_OUTPATIENT_CLINIC_OR_DEPARTMENT_OTHER): Payer: Self-pay | Admitting: Emergency Medicine

## 2022-07-03 ENCOUNTER — Other Ambulatory Visit: Payer: Self-pay

## 2022-07-03 ENCOUNTER — Emergency Department (HOSPITAL_BASED_OUTPATIENT_CLINIC_OR_DEPARTMENT_OTHER)
Admission: EM | Admit: 2022-07-03 | Discharge: 2022-07-03 | Disposition: A | Discharge status: Home / Self Care | Payer: BLUE CROSS/BLUE SHIELD | Attending: Emergency Medicine | Admitting: Emergency Medicine

## 2022-07-03 ENCOUNTER — Emergency Department (HOSPITAL_BASED_OUTPATIENT_CLINIC_OR_DEPARTMENT_OTHER): Payer: BLUE CROSS/BLUE SHIELD

## 2022-07-03 DIAGNOSIS — M5441 Lumbago with sciatica, right side: Secondary | ICD-10-CM | POA: Insufficient documentation

## 2022-07-03 DIAGNOSIS — M545 Low back pain, unspecified: Secondary | ICD-10-CM | POA: Diagnosis present

## 2022-07-03 DIAGNOSIS — Y92410 Unspecified street and highway as the place of occurrence of the external cause: Secondary | ICD-10-CM | POA: Diagnosis not present

## 2022-07-03 DIAGNOSIS — M5442 Lumbago with sciatica, left side: Secondary | ICD-10-CM | POA: Insufficient documentation

## 2022-07-03 LAB — PREGNANCY, URINE: Preg Test, Ur: NEGATIVE

## 2022-07-03 MED ORDER — KETOROLAC TROMETHAMINE 15 MG/ML IJ SOLN
15.0000 mg | Freq: Once | INTRAMUSCULAR | Status: AC
  Administered 2022-07-03: 15 mg via INTRAMUSCULAR
  Filled 2022-07-03: qty 1

## 2022-07-03 MED ORDER — ACETAMINOPHEN 500 MG PO TABS
1000.0000 mg | ORAL_TABLET | Freq: Once | ORAL | Status: AC
  Administered 2022-07-03: 1000 mg via ORAL
  Filled 2022-07-03: qty 2
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
## 2022-07-03 NOTE — Discharge Instructions (Signed)

## 2024-09-04 ENCOUNTER — Emergency Department (HOSPITAL_BASED_OUTPATIENT_CLINIC_OR_DEPARTMENT_OTHER)

## 2024-09-04 ENCOUNTER — Encounter (HOSPITAL_BASED_OUTPATIENT_CLINIC_OR_DEPARTMENT_OTHER): Payer: Self-pay | Admitting: Emergency Medicine

## 2024-09-04 ENCOUNTER — Other Ambulatory Visit: Payer: Self-pay

## 2024-09-04 ENCOUNTER — Emergency Department (HOSPITAL_BASED_OUTPATIENT_CLINIC_OR_DEPARTMENT_OTHER)
Admission: EM | Admit: 2024-09-04 | Discharge: 2024-09-05 | Disposition: A | Discharge status: Home / Self Care | Attending: Emergency Medicine | Admitting: Emergency Medicine

## 2024-09-04 DIAGNOSIS — R42 Dizziness and giddiness: Secondary | ICD-10-CM | POA: Diagnosis not present

## 2024-09-04 DIAGNOSIS — D72829 Elevated white blood cell count, unspecified: Secondary | ICD-10-CM | POA: Insufficient documentation

## 2024-09-04 DIAGNOSIS — R002 Palpitations: Secondary | ICD-10-CM | POA: Diagnosis present

## 2024-09-04 LAB — CBC WITH DIFFERENTIAL/PLATELET
Abs Immature Granulocytes: 0.03 10*3/uL (ref 0.00–0.07)
Basophils Absolute: 0 10*3/uL (ref 0.0–0.1)
Basophils Relative: 0 %
Eosinophils Absolute: 0.1 10*3/uL (ref 0.0–0.5)
Eosinophils Relative: 1 %
HCT: 36.3 % (ref 36.0–46.0)
Hemoglobin: 11.5 g/dL — ABNORMAL LOW (ref 12.0–15.0)
Immature Granulocytes: 0 %
Lymphocytes Relative: 34 %
Lymphs Abs: 4.2 10*3/uL — ABNORMAL HIGH (ref 0.7–4.0)
MCH: 26.3 pg (ref 26.0–34.0)
MCHC: 31.7 g/dL (ref 30.0–36.0)
MCV: 83.1 fL (ref 80.0–100.0)
Monocytes Absolute: 1.2 10*3/uL — ABNORMAL HIGH (ref 0.1–1.0)
Monocytes Relative: 9 %
Neutro Abs: 7.1 10*3/uL (ref 1.7–7.7)
Neutrophils Relative %: 56 %
Platelets: 342 10*3/uL (ref 150–400)
RBC: 4.37 MIL/uL (ref 3.87–5.11)
RDW: 15.2 % (ref 11.5–15.5)
WBC: 12.7 10*3/uL — ABNORMAL HIGH (ref 4.0–10.5)
nRBC: 0 % (ref 0.0–0.2)

## 2024-09-04 LAB — D-DIMER, QUANTITATIVE: D-Dimer, Quant: <0.27 ug{FEU}/mL (ref 0.00–0.50)

## 2024-09-04 LAB — COMPREHENSIVE METABOLIC PANEL WITH GFR
ALT: 17 U/L (ref 0–44)
AST: 17 U/L (ref 15–41)
Albumin: 4.3 g/dL (ref 3.5–5.0)
Alkaline Phosphatase: 79 U/L (ref 38–126)
Anion gap: 10 (ref 5–15)
BUN: 16 mg/dL (ref 6–20)
CO2: 25 mmol/L (ref 22–32)
Calcium: 9.5 mg/dL (ref 8.9–10.3)
Chloride: 102 mmol/L (ref 98–111)
Creatinine, Ser: 0.77 mg/dL (ref 0.44–1.00)
GFR, Estimated: >60 mL/min
Glucose, Bld: 102 mg/dL — ABNORMAL HIGH (ref 70–99)
Potassium: 3.9 mmol/L (ref 3.5–5.1)
Sodium: 137 mmol/L (ref 135–145)
Total Bilirubin: <0.2 mg/dL (ref 0.0–1.2)
Total Protein: 7 g/dL (ref 6.5–8.1)

## 2024-09-04 LAB — TROPONIN T, HIGH SENSITIVITY
Troponin T High Sensitivity: <6 ng/L (ref 0–19)
Troponin T High Sensitivity: <6 ng/L (ref 0–19)

## 2024-09-04 MED ORDER — DIPHENHYDRAMINE HCL 50 MG/ML IJ SOLN
25.0000 mg | Freq: Once | INTRAMUSCULAR | Status: AC
  Administered 2024-09-04: 25 mg via INTRAVENOUS
  Filled 2024-09-04: qty 1

## 2024-09-04 MED ORDER — SODIUM CHLORIDE 0.9 % IV BOLUS
1000.0000 mL | Freq: Once | INTRAVENOUS | Status: AC
  Administered 2024-09-04: 1000 mL via INTRAVENOUS

## 2024-09-04 MED ORDER — METOPROLOL TARTRATE 5 MG/5ML IV SOLN
5.0000 mg | Freq: Once | INTRAVENOUS | Status: AC
  Administered 2024-09-04: 5 mg via INTRAVENOUS
  Filled 2024-09-04: qty 5

## 2024-09-04 MED ORDER — METOCLOPRAMIDE HCL 5 MG/ML IJ SOLN
10.0000 mg | Freq: Once | INTRAMUSCULAR | Status: AC
  Administered 2024-09-04: 10 mg via INTRAVENOUS
  Filled 2024-09-04: qty 2

## 2024-09-04 MED ORDER — LORAZEPAM 2 MG/ML IJ SOLN
1.0000 mg | Freq: Once | INTRAMUSCULAR | Status: AC
  Administered 2024-09-04: 1 mg via INTRAVENOUS
  Filled 2024-09-04: qty 1

## 2024-09-04 MED ORDER — IOHEXOL 350 MG/ML SOLN
75.0000 mL | Freq: Once | INTRAVENOUS | Status: AC | PRN
  Administered 2024-09-04: 75 mL via INTRAVENOUS

## 2024-09-04 NOTE — ED Notes (Signed)
 Patient transported to CT

## 2024-09-04 NOTE — ED Provider Notes (Signed)
 " [place] [facility] Provider Note   CSN: [id number] Arrival date & time: 09/04/24  2102     Patient presents with: Palpitations   Kim Estes is a 39 y.o. female history of anxiety here presenting with palpitations and headache and dizziness.  Patient states that she was at a party earlier and noticed that her watch went off and her heart rate was in the 140s.  She is feeling fine at that time so ignored the warning.  Patient states that she came back home and was watching a movie around 7:30 PM and then had worsening dizziness and chest pain and palpitations.  Patient denies drinking any alcohol.  Patient states that happened before after she drinks some rum but she did not drink any alcohol today.  Patient has no history of arrhythmias.  Patient states that she has family history of heart problems but she states that she is not in contact with her family so she does not know the details.  Denies any recent travel   The history is provided by the patient.       Prior to Admission medications  Not on File    Allergies: Codeine, Cranberry, Petrolatum, and Solbar pf spf15    Review of Systems  Cardiovascular:  Positive for palpitations.  All other systems reviewed and are negative.   Updated Vital Signs BP 122/73   Pulse 93   Resp 13   Ht 5' 2.5 (1.588 m)   Wt 108.9 kg   SpO2 100%   BMI 43.20 kg/m   Physical Exam Vitals and nursing note reviewed.  Constitutional:      Appearance: Normal appearance.  HENT:     Head: Normocephalic.     Nose: Nose normal.     Mouth/Throat:     Mouth: Mucous membranes are moist.  Eyes:     Extraocular Movements: Extraocular movements intact.     Pupils: Pupils are equal, round, and reactive to light.  Cardiovascular:     Rate and Rhythm: Regular rhythm. Tachycardia present.     Pulses: Normal pulses.  Pulmonary:     Effort: Pulmonary effort is normal.     Breath sounds: Normal breath  sounds.  Abdominal:     General: Abdomen is flat.     Palpations: Abdomen is soft.  Musculoskeletal:        General: Normal range of motion.     Cervical back: Normal range of motion and neck supple.  Skin:    General: Skin is warm.     Capillary Refill: Capillary refill takes less than 2 seconds.  Neurological:     General: No focal deficit present.     Mental Status: She is alert and oriented to person, place, and time.     Cranial Nerves: No cranial nerve deficit.     Comments: Cranial nerves II to XII intact.  Patient has normal strength and sensation bilateral arms and legs  Psychiatric:        Mood and Affect: Mood normal.        Behavior: Behavior normal.     (all labs ordered are listed, but only abnormal results are displayed) Labs Reviewed  CBC WITH DIFFERENTIAL/PLATELET - Abnormal; Notable for the following components:      Result Value   WBC 12.7 (*)    Hemoglobin 11.5 (*)    Lymphs Abs 4.2 (*)    Monocytes Absolute 1.2 (*)    All other components  within normal limits  COMPREHENSIVE METABOLIC PANEL WITH GFR - Abnormal; Notable for the following components:   Glucose, Bld 102 (*)    All other components within normal limits  D-DIMER, QUANTITATIVE  TROPONIN T, HIGH SENSITIVITY  TROPONIN T, HIGH SENSITIVITY    EKG: EKG Interpretation Date/Time:  Saturday September 04 2024 21:13:07 EST Ventricular Rate:  90 PR Interval:  155 QRS Duration:  97 QT Interval:  347 QTC Calculation: 425 R Axis:   78  Text Interpretation: Sinus rhythm Probable left atrial enlargement RSR' in V1 or V2, right VCD or RVH Borderline T abnormalities, inferior leads No significant change since last tracing Confirmed by Patt Alm DEL ([id number]) on 09/04/2024 10:03:02 PM  Radiology: [facility] Chest Port 1 View Result Date: 09/04/2024 EXAM: 1 VIEW(S) XRAY OF THE CHEST 09/04/2024 09:23:00 PM COMPARISON: Comparison with 01/19/2019. CLINICAL HISTORY: Chest pain. Elevated heart rate, palpitations,  dizziness, shortness of breath, and nausea. FINDINGS: LUNGS AND PLEURA: Lungs are clear. No pleural effusion or pneumothorax. HEART AND MEDIASTINUM: Heart size and pulmonary vascularity are normal. Mediastinal contours appear intact. BONES AND SOFT TISSUES: No acute osseous abnormality. IMPRESSION: 1. No acute cardiopulmonary abnormality. Electronically signed by: Elsie Gravely MD 09/04/2024 09:30 PM EST RP Workstation: [id number]     Procedures   Medications Ordered in the ED  iohexol  (OMNIPAQUE ) 350 MG/ML injection 75 mL (has no administration in time range)  sodium chloride  0.9 % bolus 1,000 mL (1,000 mLs Intravenous New Bag/Given 09/04/24 2156)  LORazepam  (ATIVAN ) injection 1 mg (1 mg Intravenous Given 09/04/24 2156)  metoprolol  tartrate (LOPRESSOR ) injection 5 mg (5 mg Intravenous Given 09/04/24 2233)  metoCLOPramide  (REGLAN ) injection 10 mg (10 mg Intravenous Given 09/04/24 2236)  diphenhydrAMINE  (BENADRYL ) injection 25 mg (25 mg Intravenous Given 09/04/24 2235)                                    Medical Decision Making GENEVIVE Estes is a 39 y.o. female here presenting with dizziness and palpitations.  Consider PE versus intracranial bleeding versus ACS.  Plan to get CBC and CMP and troponin x 2 and D-dimer and CTA head and neck.  10:42 PM Labs unremarkable.  CTA pending.  Repeat troponin pending.  Patient has negative D-dimer.  11:10 PM CTA head and neck pending.  Signed out to Dr. Jerrol in the ED   Amount and/or Complexity of Data Reviewed Labs: ordered. Radiology: ordered.  Risk Prescription drug management.     Final diagnoses:  None    ED Discharge Orders     None          Patt Alm Macho, MD 09/04/24 2310  "

## 2024-09-04 NOTE — ED Triage Notes (Signed)
 Pt reports elevated HR according to her watch several times this afternoon (3pm); while she was watching a movie tonight around 7:30pm, she reports palpitations, dizziness, SHOB, nausea; appears anxious; feels pressure around chest

## 2024-09-04 NOTE — ED Provider Notes (Signed)
" °  Physical Exam  BP 122/73   Pulse 93   Resp 13   Ht 5' 2.5 (1.588 m)   Wt 108.9 kg   SpO2 100%   BMI 43.20 kg/m   Physical Exam  Procedures  Procedures  ED Course / MDM    Medical Decision Making Amount and/or Complexity of Data Reviewed Labs: ordered. Radiology: ordered.  Risk Prescription drug management.   58F presenting with a HR in the 140s, palpitations, SOB, dizziness. Labs reassuring. No hx of SVT or afib. Was endorsing headache, obtained CTA head and neck. Likely needs holter monitor. Pending delta troponin.    CTA Head and Neck: IMPRESSION:  1.  No large vessel occlusion or significant stenosis.  2. No obvious acute intracranial abnormality on a limited head CT.   Delta troponin normal. Pt HRs in the 70s and 80s on telemetry.  Patient advised to follow-up outpatient with her primary care provider for cardiac monitor placement, overall stable for discharge.    Jerrol Agent, MD 09/04/24 2344  "

## 2024-09-04 NOTE — ED Notes (Signed)
 Patient transported to X-ray

## 2024-09-04 NOTE — Discharge Instructions (Signed)
 Your workup to include CT imaging, EKG, laboratory evaluation was overall reassuring.  You had no abnormalities on cardiac telemetry or EKG.  It is recommended that you follow-up with your primary care provider to discuss outpatient cardiac monitor placement
